# Patient Record
Sex: Male | Born: 1985 | Race: Black or African American | Hispanic: No | Marital: Single | Smoking: Current every day smoker
Health system: Southern US, Community
[De-identification: ages and names within clinical notes are randomized; demographics above are authoritative.]

---

## 2017-05-19 ENCOUNTER — Emergency Department (HOSPITAL_COMMUNITY): Payer: Self-pay

## 2017-05-19 ENCOUNTER — Emergency Department (HOSPITAL_COMMUNITY)
Admission: EM | Admit: 2017-05-19 | Discharge: 2017-05-19 | Disposition: A | Discharge status: Home / Self Care | Payer: Self-pay | Attending: Emergency Medicine | Admitting: Emergency Medicine

## 2017-05-19 ENCOUNTER — Encounter (HOSPITAL_COMMUNITY): Payer: Self-pay

## 2017-05-19 DIAGNOSIS — F17210 Nicotine dependence, cigarettes, uncomplicated: Secondary | ICD-10-CM | POA: Insufficient documentation

## 2017-05-19 DIAGNOSIS — R0789 Other chest pain: Secondary | ICD-10-CM | POA: Insufficient documentation

## 2017-05-19 DIAGNOSIS — R079 Chest pain, unspecified: Secondary | ICD-10-CM

## 2017-05-19 LAB — I-STAT TROPONIN, ED: Troponin i, poc: 0 ng/mL (ref 0.00–0.08)

## 2017-05-19 LAB — BASIC METABOLIC PANEL
ANION GAP: 9 (ref 5–15)
BUN: 9 mg/dL (ref 6–20)
CHLORIDE: 100 mmol/L — AB (ref 101–111)
CO2: 26 mmol/L (ref 22–32)
Calcium: 9.1 mg/dL (ref 8.9–10.3)
Creatinine, Ser: 1.18 mg/dL (ref 0.61–1.24)
GFR calc non Af Amer: >60 mL/min (ref >60)
Glucose, Bld: 119 mg/dL — ABNORMAL HIGH (ref 65–99)
Potassium: 3.7 mmol/L (ref 3.5–5.1)
SODIUM: 135 mmol/L (ref 135–145)

## 2017-05-19 LAB — CBC
HEMATOCRIT: 39.9 % (ref 39.0–52.0)
Hemoglobin: 13.4 g/dL (ref 13.0–17.0)
MCH: 28.9 pg (ref 26.0–34.0)
MCHC: 33.6 g/dL (ref 30.0–36.0)
MCV: 86.2 fL (ref 78.0–100.0)
PLATELETS: 424 10*3/uL — AB (ref 150–400)
RBC: 4.63 MIL/uL (ref 4.22–5.81)
RDW: 14.4 % (ref 11.5–15.5)
WBC: 12.1 10*3/uL — AB (ref 4.0–10.5)

## 2017-05-19 LAB — D-DIMER, QUANTITATIVE (NOT AT ARMC)

## 2017-05-19 NOTE — ED Provider Notes (Signed)
[facility]-EMERGENCY DEPT Provider Note   CSN: [id number] Arrival date & time: 05/19/17  2133     History   Chief Complaint Chief Complaint  Patient presents with  . Code STEMI    HPI Cruz CondonMichael Batten is a 31 y.o. male.  31 year old male who presents with chest pain. This morning approximately 8 hours ago the patient was playing video games and he began having central chest tightness that eventually radiated to his right shoulder. He continued to ignore it but it has intermittently been bothering him all day. He has had some mild worsening of the pain when he breathes then but no significant shortness of breath. No associated nausea, vomiting, or diaphoresis. This evening his sister became concerned and encouraged him to call EMS. EMS gave him one nitroglycerin in route. STEMI was called due to abnormal EKG. He denies any cocaine or other illicit drug use. No recent travel or history of blood clots. No family history of early heart disease.   The history is provided by the patient.    History reviewed. No pertinent past medical history.  There are no active problems to display for this patient.   History reviewed. No pertinent surgical history.     Home Medications    Prior to Admission medications   Medication Sig Start Date End Date Taking? Authorizing Provider  aspirin EC 325 MG tablet Take 650 mg by mouth once as needed (FOR CHEST PAIN).   Yes [provider]    Family History History reviewed. No pertinent family history.  Social History Social History  Substance Use Topics  . Smoking status: Current Every Day Smoker    Packs/day: 0.50    Types: Cigarettes  . Smokeless tobacco: Not on file  . Alcohol use Yes     Comment: occ     Allergies   Patient has no known allergies.   Review of Systems Review of Systems All other systems reviewed and are negative except that which was mentioned in HPI   Physical Exam Updated Vital Signs BP 115/72   Pulse  85   Temp 98 F (36.7 C) (Oral)   Resp 13   Ht 6\' 2"  (1.88 m)   Wt 88.5 kg (195 lb)   SpO2 100%   BMI 25.04 kg/m   Physical Exam  Constitutional: He is oriented to person, place, and time. He appears well-developed and well-nourished. No distress.  HENT:  Head: Normocephalic and atraumatic.  Moist mucous membranes  Eyes: Pupils are equal, round, and reactive to light. Conjunctivae are normal.  Neck: Neck supple.  Cardiovascular: Normal rate, regular rhythm and normal heart sounds.   No murmur heard. Pulmonary/Chest: Effort normal and breath sounds normal.  Abdominal: Soft. Bowel sounds are normal. He exhibits no distension. There is no tenderness.  Musculoskeletal: He exhibits no edema.  Neurological: He is alert and oriented to person, place, and time.  Fluent speech  Skin: Skin is warm and dry.  Psychiatric: Judgment normal.  Mildly anxious  Nursing note and vitals reviewed.    ED Treatments / Results  Labs (all labs ordered are listed, but only abnormal results are displayed) Labs Reviewed  BASIC METABOLIC PANEL - Abnormal; Notable for the following:       Result Value   Chloride 100 (*)    Glucose, Bld 119 (*)    All other components within normal limits  CBC - Abnormal; Notable for the following:    WBC 12.1 (*)    Platelets 424 (*)  All other components within normal limits  D-DIMER, QUANTITATIVE (NOT AT [facility])  I-STAT TROPONIN, ED    EKG  EKG Interpretation  Date/Time:  Tuesday May 19 2017 21:38:22 EDT Ventricular Rate:  96 PR Interval:    QRS Duration: 79 QT Interval:  299 QTC Calculation: 378 R Axis:   69 Text Interpretation:  Sinus rhythm Inferolateral infarct, acute Borderline ST elevation, anterior leads Artifact No previous ECGs available Confirmed by Frederick Peers [phone]) on 05/19/2017 9:47:02 PM       Radiology Dg Chest Portable 1 View  Result Date: 05/19/2017 CLINICAL DATA:  Central chest pain radiating to the right shoulder EXAM:  PORTABLE CHEST 1 VIEW COMPARISON:  None. FINDINGS: The heart size and mediastinal contours are within normal limits. Both lungs are clear. The visualized skeletal structures are unremarkable. IMPRESSION: No active disease. Electronically Signed   By: Jasmine Pang M.D.   On: 05/19/2017 22:11    Procedures Procedures (including critical care time)  Medications Ordered in ED Medications - No data to display   Initial Impression / Assessment and Plan / ED Course  I have reviewed the triage vital signs and the nursing notes.  Pertinent labs & imaging results that were available during my care of the patient were reviewed by me and considered in my medical decision making (see chart for details).     Pt w/ intermittent chest pain that has been bothering him all day, STEMI alert activated based on abnormal EKG with EMS. On arrival he was awake and alert, in no acute distress with stable vital signs. His EKG shows diffuse ST elevation in all leads with no reciprocal depression. I discussed his EKG and symptoms with cardiologist Dr. Eldridge Dace, who felt his EKG was benign early repolarization. We agreed to cancel code STEMI given young age and extremely low risk for ACS. His labs here including trop, d-dimer and basic labs are normal. On reexamination he states that his symptoms have resolved. His HEART score is 2 and I feel he is safe for discharge. I have discussed supportive measures and extensively reviewed return precautions with the patient and his family. They voiced understanding and patient was discharged in satisfactory condition.  Final Clinical Impressions(s) / ED Diagnoses   Final diagnoses:  Chest pain, unspecified type    New Prescriptions New Prescriptions   No medications on file     Consuella Scurlock, Ambrose Finland, MD 05/19/17 2322

## 2017-05-19 NOTE — ED Notes (Signed)
Pt verbalized understanding of d/c instructions and has no further questions. Pt stable and NAD. VSS. Pt removed all belongings from room.  

## 2017-05-19 NOTE — ED Notes (Signed)
Dr Clarene DukeLittle ordered to cancel code stemi per Cardiology

## 2017-05-19 NOTE — ED Triage Notes (Addendum)
Per EMS, pt from home, 8 hours ago pt began having central chest pain with radiation to the right shoulder. Pt states that he ignored it and it became a nagging pain. 1 hour pta pt took 325 aspirin and then after an hour of no relief he called 911. Pt denies exertion any today, has just been playing video games. VS 132/76, HR 86, RR 16, spo2 98% on RA. Pt received 1 nitro in route without relief. No medical hx or family hx.

## 2017-05-20 NOTE — Progress Notes (Signed)
   05/19/17 2141  Clinical Encounter Type  Visited With Patient  Visit Type ED  Referral From Nurse  Consult/Referral To None  Spiritual Encounters  Spiritual Needs Emotional  Stress Factors  Patient Stress Factors Health changes  Family Stress Factors None identified  Advance Directives (For Healthcare)  Does Patient Have a Medical Advance Directive? No  Would patient like information on creating a medical advance directive? No - Patient declined  [place]  Does Patient Have a Mental Health Advance Directive? No  Would patient like information on creating a mental health advance directive? No - Patient declined  Met with patient and mother at the time of entry into the ER.  Patient was getting triaged while I attended to the needs of the mother.  She was visibly shaken and walking with a cane as she was escorted into the ER trauma bay.  Mother expressed patient was her middle son and had no real heart issues.  She did express she had some issues personally in the past with the heart.  Chaplain provided emotional support to patient and mother as another sibling did enter into the room.  Mother stated no real needs but prayer would be helpful.    30 minutes and multiple conversations

## 2018-06-10 ENCOUNTER — Other Ambulatory Visit: Payer: Self-pay

## 2018-06-10 ENCOUNTER — Emergency Department (HOSPITAL_COMMUNITY)
Admission: EM | Admit: 2018-06-10 | Discharge: 2018-06-10 | Discharge status: Against Medical Advice | Payer: Self-pay | Attending: Emergency Medicine | Admitting: Emergency Medicine

## 2018-06-10 ENCOUNTER — Emergency Department (HOSPITAL_COMMUNITY): Payer: Self-pay

## 2018-06-10 ENCOUNTER — Encounter (HOSPITAL_COMMUNITY): Payer: Self-pay | Admitting: Emergency Medicine

## 2018-06-10 DIAGNOSIS — Z23 Encounter for immunization: Secondary | ICD-10-CM | POA: Diagnosis not present

## 2018-06-10 DIAGNOSIS — M25511 Pain in right shoulder: Secondary | ICD-10-CM | POA: Diagnosis not present

## 2018-06-10 DIAGNOSIS — R51 Headache: Secondary | ICD-10-CM | POA: Insufficient documentation

## 2018-06-10 DIAGNOSIS — T148XXA Other injury of unspecified body region, initial encounter: Secondary | ICD-10-CM

## 2018-06-10 DIAGNOSIS — M542 Cervicalgia: Secondary | ICD-10-CM | POA: Insufficient documentation

## 2018-06-10 MED ORDER — NAPROXEN 375 MG PO TABS: 375.0000 mg | ORAL_TABLET | Freq: Two times a day (BID) | ORAL | 0 refills | Status: AC

## 2018-06-10 MED ORDER — HYDROCODONE-ACETAMINOPHEN 5-325 MG PO TABS
1.0000 | ORAL_TABLET | Freq: Once | ORAL | Status: AC
  Administered 2018-06-10: 1 via ORAL
  Filled 2018-06-10: qty 1

## 2018-06-10 MED ORDER — TETANUS-DIPHTH-ACELL PERTUSSIS 5-2.5-18.5 LF-MCG/0.5 IM SUSP
0.5000 mL | Freq: Once | INTRAMUSCULAR | Status: AC
  Administered 2018-06-10: 0.5 mL via INTRAMUSCULAR
  Filled 2018-06-10: qty 0.5

## 2018-06-10 MED ORDER — CYCLOBENZAPRINE HCL 10 MG PO TABS: 5.0000 mg | ORAL_TABLET | Freq: Two times a day (BID) | ORAL | 0 refills | Status: AC | PRN

## 2018-06-10 NOTE — ED Triage Notes (Signed)
Attempted to triage pt, pt on phone, advised pt I would be back when he finished his phone call

## 2018-06-10 NOTE — ED Notes (Signed)
Pt family came to RN asking for pts discharge paperwork. RN went to get vitals and go over paperwork. Family states pt is already gone.

## 2018-06-10 NOTE — Discharge Instructions (Signed)
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
## 2018-06-10 NOTE — ED Notes (Signed)
Pt still in CT AND XRAY
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
## 2018-08-09 IMAGING — DX DG CHEST 1V PORT
1 series · 1 of 1 positions shown · non-contrast
Comparison: None.

CLINICAL DATA: Central chest pain radiating to the right shoulder

EXAM:
PORTABLE CHEST 1 VIEW

[chest]
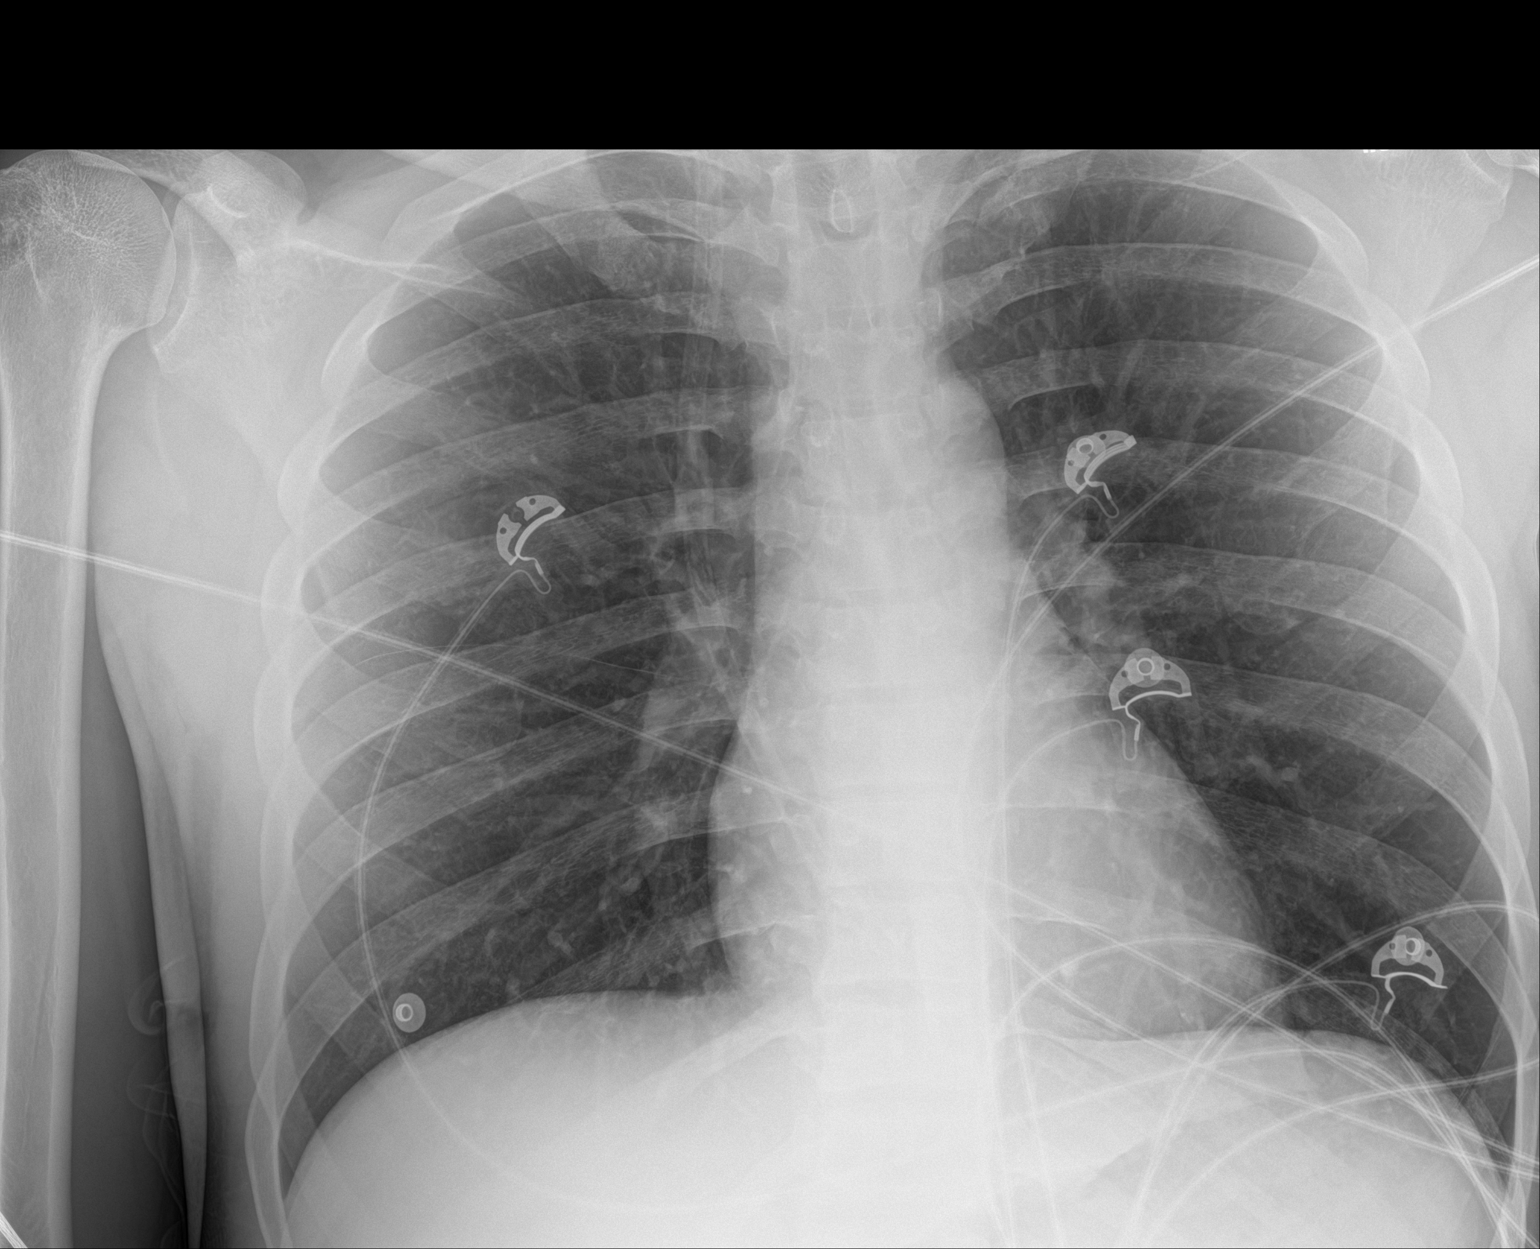

[1 of 1 positions shown; findings below may reference images not displayed]

FINDINGS: The heart size and mediastinal contours are within normal limits.
Both lungs are clear. The visualized skeletal structures are
unremarkable.
IMPRESSION: No active disease.
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
